# Patient Record
Sex: Male | Born: 2000 | Race: Black or African American | Hispanic: No | Marital: Single
Health system: Southern US, Community
[De-identification: ages and names within clinical notes are randomized; demographics above are authoritative.]

## PROBLEM LIST (undated history)

## (undated) DIAGNOSIS — J45909 Unspecified asthma, uncomplicated: Secondary | ICD-10-CM

---
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
## 2022-03-28 ENCOUNTER — Other Ambulatory Visit: Payer: Self-pay

## 2022-03-28 ENCOUNTER — Encounter (HOSPITAL_COMMUNITY): Payer: Self-pay | Admitting: Emergency Medicine

## 2022-03-28 ENCOUNTER — Emergency Department (HOSPITAL_COMMUNITY): Payer: Managed Care, Other (non HMO)

## 2022-03-28 ENCOUNTER — Emergency Department (HOSPITAL_COMMUNITY)
Admission: EM | Admit: 2022-03-28 | Discharge: 2022-03-28 | Disposition: A | Discharge status: Home / Self Care | Payer: Managed Care, Other (non HMO) | Attending: Emergency Medicine | Admitting: Emergency Medicine

## 2022-03-28 DIAGNOSIS — J4521 Mild intermittent asthma with (acute) exacerbation: Secondary | ICD-10-CM | POA: Diagnosis not present

## 2022-03-28 DIAGNOSIS — Z1152 Encounter for screening for COVID-19: Secondary | ICD-10-CM | POA: Diagnosis not present

## 2022-03-28 DIAGNOSIS — J069 Acute upper respiratory infection, unspecified: Secondary | ICD-10-CM | POA: Insufficient documentation

## 2022-03-28 DIAGNOSIS — Z7951 Long term (current) use of inhaled steroids: Secondary | ICD-10-CM | POA: Insufficient documentation

## 2022-03-28 DIAGNOSIS — R059 Cough, unspecified: Secondary | ICD-10-CM | POA: Diagnosis present

## 2022-03-28 LAB — RESP PANEL BY RT-PCR (FLU A&B, COVID) ARPGX2
Influenza A by PCR: NEGATIVE
Influenza B by PCR: NEGATIVE
SARS Coronavirus 2 by RT PCR: NEGATIVE

## 2022-03-28 MED ORDER — BENZONATATE 100 MG PO CAPS: 100.0000 mg | ORAL_CAPSULE | Freq: Three times a day (TID) | ORAL | 0 refills | Status: AC

## 2022-03-28 MED ORDER — IPRATROPIUM-ALBUTEROL 0.5-2.5 (3) MG/3ML IN SOLN
3.0000 mL | Freq: Once | RESPIRATORY_TRACT | Status: AC
  Administered 2022-03-28: 3 mL via RESPIRATORY_TRACT
  Filled 2022-03-28: qty 3

## 2022-03-28 MED ORDER — METHYLPREDNISOLONE 4 MG PO TBPK: ORAL_TABLET | ORAL | 0 refills | Status: AC

## 2022-03-28 MED ORDER — PREDNISONE 20 MG PO TABS
60.0000 mg | ORAL_TABLET | Freq: Once | ORAL | Status: AC
  Administered 2022-03-28: 60 mg via ORAL
  Filled 2022-03-28: qty 3

## 2022-03-28 MED ORDER — ALBUTEROL SULFATE HFA 108 (90 BASE) MCG/ACT IN AERS: 1.0000 | INHALATION_SPRAY | Freq: Four times a day (QID) | RESPIRATORY_TRACT | 0 refills | Status: AC | PRN

## 2022-03-28 NOTE — ED Provider Notes (Signed)
[facility]-EMERGENCY DEPT Provider Note   CSN: [id number] Arrival date & time: 03/28/22  1555     History  Chief Complaint  Patient presents with   Chest Pain   Cough    Lawrence Gonzalez is a 21 y.o. male.  Patient with history of asthma presents today with complaints of cough and chest tightness. States that same has been ongoing for 2 days. States he has been coughing up green mucus. States that he has been around children sick with similar symptoms. States that he was taking his home albuterol inhaler for same but he ran out recently.  States that his symptoms are consistent with asthma exacerbations that he has had previously.  Denies fevers or chills.  The history is provided by the patient. No language interpreter was used.  Chest Pain Associated symptoms: cough   Cough Associated symptoms: chest pain        Home Medications Prior to Admission medications   Not on File      Allergies    Patient has no allergy information on record.    Review of Systems   Review of Systems  Respiratory:  Positive for cough.   Cardiovascular:  Positive for chest pain.  All other systems reviewed and are negative.   Physical Exam Updated Vital Signs BP 105/61   Pulse 89   Temp 98.6 F (37 C) (Oral)   Resp 14   Ht 4\' 9"  (1.448 m)   Wt 43.1 kg   SpO2 96%   BMI 20.56 kg/m  Physical Exam Vitals and nursing note reviewed.  Constitutional:      General: He is not in acute distress.    Appearance: Normal appearance. He is normal weight. He is not ill-appearing, toxic-appearing or diaphoretic.  HENT:     Head: Normocephalic and atraumatic.  Cardiovascular:     Rate and Rhythm: Normal rate and regular rhythm.     Heart sounds: Normal heart sounds.  Pulmonary:     Effort: Pulmonary effort is normal. No respiratory distress.     Breath sounds: Wheezing present.  Abdominal:     Palpations: Abdomen is soft.  Musculoskeletal:        General: Normal  range of motion.     Cervical back: Normal range of motion.     Right lower leg: No tenderness. No edema.     Left lower leg: No tenderness. No edema.  Skin:    General: Skin is warm and dry.  Neurological:     General: No focal deficit present.     Mental Status: He is alert.  Psychiatric:        Mood and Affect: Mood normal.        Behavior: Behavior normal.     ED Results / Procedures / Treatments   Labs (all labs ordered are listed, but only abnormal results are displayed) Labs Reviewed  RESP PANEL BY RT-PCR (FLU A&B, COVID) [id number]    EKG EKG Interpretation  Date/Time:  Thursday March 28 2022 16:02:27 EDT Ventricular Rate:  96 PR Interval:  131 QRS Duration: 87 QT Interval:  345 QTC Calculation: 436 R Axis:   82 Text Interpretation: Normal sinus rhythm Borderline T abnormalities, inferior leads No previous ECGs available Confirmed by 09-30-1998 [phone]) on 03/28/2022 6:18:26 PM  Radiology DG Chest 2 View  Result Date: 03/28/2022 CLINICAL DATA:  Productive cough EXAM: CHEST - 2 VIEW COMPARISON:  Images of previous study done on 08-08-2000 are not available for  comparison. Report for the previous study was reviewed. FINDINGS: Cardiac size is within normal limits. Lung fields are clear of any infiltrates or pulmonary edema. Increase in AP diameter of chest may be normal variation due to patient's body habitus or suggest air trapping. There is no pleural effusion or pneumothorax. IMPRESSION: No focal pulmonary infiltrates are seen. There is no pleural effusion. Increase in AP diameter of chest may be normal variation due to patient's body habitus or suggest air trapping due to asthma or bronchiolitis. Electronically Signed   By: Elmer Picker M.D.   On: 03/28/2022 16:44    Procedures Procedures    Medications Ordered in ED Medications  ipratropium-albuterol (DUONEB) 0.5-2.5 (3) MG/3ML nebulizer solution 3 mL (3 mLs Nebulization Given 03/28/22 1850)   predniSONE (DELTASONE) tablet 60 mg (60 mg Oral Given 03/28/22 1850)    ED Course/ Medical Decision Making/ A&P                           Medical Decision Making Amount and/or Complexity of Data Reviewed Radiology: ordered.  Risk Prescription drug management.   Patient presents today with complaints of cough and congestion with chest tightness x 2 days.  He is afebrile, nontoxic-appearing, and in no acute distress with reassuring vital signs.  Physical exam initially reveals wheezing throughout.  Chest x-ray is obtained shows increased AP diameter likely due to patient's asthma.  I have personally reviewed and interpreted this imaging and agree with radiology interpretation.  EKG reassuring.  COVID and flu negative.  Patient given DuoNebs and steroids in the ER with significant improvement of his symptoms.  After administration of these interventions, patient feels stable for discharge. Given patient's history of asthma with significant improvement after nebs and steroids, with reassuring work-up otherwise, very low suspicion for ACS/PE/dissection or any other emergent etiology of symptoms.  Suspect patients symptoms are related to his asthma with likely URI as well. He is stable for discharge.  Will refill albuterol and give Tessalon for cough and medrol dosepak for asthma.  Patient is understanding and amenable with plan, emphasized portance of close PCP follow-up.  Patient also educated on red flag symptoms of prompt immediate return.  Patient discharged in stable condition.   Final Clinical Impression(s) / ED Diagnoses Final diagnoses:  Viral URI with cough  Mild intermittent asthma with acute exacerbation    Rx / DC Orders ED Discharge Orders          Ordered    albuterol (VENTOLIN HFA) 108 (90 Base) MCG/ACT inhaler  Every 6 hours PRN        03/28/22 1941    benzonatate (TESSALON) 100 MG capsule  Every 8 hours        03/28/22 1941    methylPREDNISolone (MEDROL DOSEPAK) 4 MG TBPK  tablet        03/28/22 1942          An After Visit Summary was printed and given to the patient.     Nestor Lewandowsky 03/28/22 1944    Ezequiel Essex, MD 03/29/22 [phone]

## 2022-03-28 NOTE — ED Triage Notes (Signed)
Pt arrives w/ c/o chest pain & cough. Pt reports coughing up green mucus x 2 days.

## 2022-03-28 NOTE — Discharge Instructions (Addendum)
As we discussed, your work-up in the ER today was reassuring for acute findings.  I suspect that your symptoms are related to your asthma as well as a likely upper respiratory infection.  As these are almost always viral in nature, no antibiotics are indicated.  I have refilled your albuterol inhaler and given you a steroid Dosepak for management of your asthma symptoms.  Have also given you a prescription for Tessalon which is a cough suppressant medication for you to take as needed.  Return if development of any new or worsening symptoms.

## 2023-07-14 ENCOUNTER — Emergency Department (HOSPITAL_BASED_OUTPATIENT_CLINIC_OR_DEPARTMENT_OTHER): Payer: Managed Care, Other (non HMO)

## 2023-07-14 ENCOUNTER — Emergency Department (HOSPITAL_BASED_OUTPATIENT_CLINIC_OR_DEPARTMENT_OTHER)
Admission: EM | Admit: 2023-07-14 | Discharge: 2023-07-15 | Disposition: A | Discharge status: Home / Self Care | Payer: Managed Care, Other (non HMO) | Attending: Emergency Medicine | Admitting: Emergency Medicine

## 2023-07-14 ENCOUNTER — Other Ambulatory Visit: Payer: Self-pay

## 2023-07-14 ENCOUNTER — Encounter (HOSPITAL_BASED_OUTPATIENT_CLINIC_OR_DEPARTMENT_OTHER): Payer: Self-pay

## 2023-07-14 DIAGNOSIS — M79672 Pain in left foot: Secondary | ICD-10-CM | POA: Diagnosis not present

## 2023-07-14 DIAGNOSIS — S9032XA Contusion of left foot, initial encounter: Secondary | ICD-10-CM

## 2023-07-14 DIAGNOSIS — M542 Cervicalgia: Secondary | ICD-10-CM | POA: Insufficient documentation

## 2023-07-14 DIAGNOSIS — R519 Headache, unspecified: Secondary | ICD-10-CM | POA: Insufficient documentation

## 2023-07-14 DIAGNOSIS — S0990XA Unspecified injury of head, initial encounter: Secondary | ICD-10-CM

## 2023-07-14 DIAGNOSIS — Y92410 Unspecified street and highway as the place of occurrence of the external cause: Secondary | ICD-10-CM | POA: Diagnosis not present

## 2023-07-14 DIAGNOSIS — J45909 Unspecified asthma, uncomplicated: Secondary | ICD-10-CM | POA: Diagnosis not present

## 2023-07-14 DIAGNOSIS — M79602 Pain in left arm: Secondary | ICD-10-CM | POA: Diagnosis not present

## 2023-07-14 DIAGNOSIS — R1012 Left upper quadrant pain: Secondary | ICD-10-CM | POA: Diagnosis not present

## 2023-07-14 DIAGNOSIS — S161XXA Strain of muscle, fascia and tendon at neck level, initial encounter: Secondary | ICD-10-CM

## 2023-07-14 HISTORY — DX: Unspecified asthma, uncomplicated: J45.909

## 2023-07-14 NOTE — ED Notes (Signed)
 Patient transported to CT
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
## 2023-07-15 NOTE — Discharge Instructions (Signed)
Take ibuprofen 400 mg every 6 hours as needed for pain.  Return to the emergency department if you develop severe headache, difficulty breathing, severe abdominal pain, or for other new and concerning symptoms.
